# Patient Record
Sex: Male | Born: 1960 | Race: White | Hispanic: No | Marital: Single | Smoking: Former smoker
Health system: Southern US, Community
[De-identification: ages and names within clinical notes are randomized; demographics above are authoritative.]

## PROBLEM LIST (undated history)

## (undated) HISTORY — PX: APPENDECTOMY: SHX54

---

## 2001-06-29 ENCOUNTER — Encounter: Payer: Self-pay | Admitting: Emergency Medicine

## 2001-06-29 ENCOUNTER — Emergency Department (HOSPITAL_COMMUNITY): Admission: EM | Admit: 2001-06-29 | Discharge: 2001-06-29 | Payer: Self-pay | Admitting: Emergency Medicine

## 2002-12-27 ENCOUNTER — Emergency Department (HOSPITAL_COMMUNITY): Admission: EM | Admit: 2002-12-27 | Discharge: 2002-12-27 | Payer: Self-pay | Admitting: Emergency Medicine

## 2015-07-08 ENCOUNTER — Emergency Department (HOSPITAL_COMMUNITY): Payer: Self-pay

## 2015-07-08 ENCOUNTER — Encounter (HOSPITAL_COMMUNITY): Payer: Self-pay

## 2015-07-08 ENCOUNTER — Emergency Department (HOSPITAL_COMMUNITY)
Admission: EM | Admit: 2015-07-08 | Discharge: 2015-07-08 | Disposition: A | Discharge status: Home / Self Care | Payer: Self-pay | Attending: Emergency Medicine | Admitting: Emergency Medicine

## 2015-07-08 DIAGNOSIS — Y9301 Activity, walking, marching and hiking: Secondary | ICD-10-CM | POA: Insufficient documentation

## 2015-07-08 DIAGNOSIS — W01198A Fall on same level from slipping, tripping and stumbling with subsequent striking against other object, initial encounter: Secondary | ICD-10-CM | POA: Insufficient documentation

## 2015-07-08 DIAGNOSIS — Y998 Other external cause status: Secondary | ICD-10-CM | POA: Insufficient documentation

## 2015-07-08 DIAGNOSIS — S52132A Displaced fracture of neck of left radius, initial encounter for closed fracture: Secondary | ICD-10-CM | POA: Insufficient documentation

## 2015-07-08 DIAGNOSIS — Y92093 Driveway of other non-institutional residence as the place of occurrence of the external cause: Secondary | ICD-10-CM | POA: Insufficient documentation

## 2015-07-08 DIAGNOSIS — Z87891 Personal history of nicotine dependence: Secondary | ICD-10-CM | POA: Insufficient documentation

## 2015-07-08 DIAGNOSIS — S2232XA Fracture of one rib, left side, initial encounter for closed fracture: Secondary | ICD-10-CM | POA: Insufficient documentation

## 2015-07-08 MED ORDER — HYDROCODONE-IBUPROFEN 5-200 MG PO TABS
1.0000 | ORAL_TABLET | Freq: Three times a day (TID) | ORAL | Status: AC | PRN
Start: 2015-07-08 — End: ?

## 2015-07-08 NOTE — ED Provider Notes (Signed)
CSN: [id number]     Arrival date & time 07/08/15  4098 History   First MD Initiated Contact with Patient 07/08/15 1012     Chief Complaint  Patient presents with  . Fall  . Shoulder Injury    HPI    54 year old male presents today with left shoulder, elbow, rib pain.atient reports that he was walking down his driveway backwards doing yard work, tripped over his lawnmower falling back landing on his left shoulder and elbow. He reports immediate pain at that time. He reports difficulty with range of motion of the shoulder, elbow, pronation supination of the forearm. He reports bruising and minor abrasions to the posterior shoulder He denies any significant injuries to the shoulder and elbow previously. He does report left-sided rib pain, reports pain with deep inspiration, denies shortness of breath. Patient reports taking ibuprofen at home with minimal relief of symptoms.    History reviewed. No pertinent past medical history. Past Surgical History  Procedure Laterality Date  . Appendectomy     History reviewed. No pertinent family history. Social History  Substance Use Topics  . Smoking status: Former Games developer  . Smokeless tobacco: None  . Alcohol Use: Yes     Comment: social    Review of Systems  All other systems reviewed and are negative.   Allergies  Review of patient's allergies indicates no known allergies.  Home Medications   Prior to Admission medications   Medication Sig Start Date End Date Taking? Authorizing Provider  hydrocodone-ibuprofen (VICOPROFEN) 5-200 MG per tablet Take 1 tablet by mouth every 8 (eight) hours as needed for pain. 07/08/15   Jaedin Regina, PA-C   BP 141/83 mmHg  Pulse 70  Temp(Src) 97.7 F (36.5 C) (Oral)  Resp 17  SpO2 100%   Physical Exam  Constitutional: He is oriented to person, place, and time. He appears well-developed and well-nourished.  HENT:  Head: Normocephalic and atraumatic.  Eyes: Conjunctivae are normal. Pupils are  equal, round, and reactive to light. Right eye exhibits no discharge. Left eye exhibits no discharge. No scleral icterus.  Neck: Normal range of motion. No JVD present. No tracheal deviation present.  Pulmonary/Chest: Effort normal. No stridor.  Musculoskeletal:  Tenderness to palpation of the left radial head, and left shoulder diffusely no obvious gross deformities. Patient has pain with pronation and supination of the left extremity, grip strength 5 out of 5 distal sensation and capillary refill intact  Neurological: He is alert and oriented to person, place, and time. Coordination normal.  Psychiatric: He has a normal mood and affect. His behavior is normal. Judgment and thought content normal.  Nursing note and vitals reviewed.   ED Course  Procedures (including critical care time) Labs Review Labs Reviewed - No data to display  Imaging Review No results found. I, Mckaylin Bastien Todd, personally reviewed and evaluated these images and lab results as part of my medical decision-making.   EKG Interpretation None      MDM   Final diagnoses:  Radial neck fracture, left, closed, initial encounter  Rib fracture, left, closed, initial encounter    Labs:  Imaging: DG shoulder left, DG elbow left, DG ribs left-significant for nondisplaced radial neck fracture, possible acute nondisplaced left sixth rib fracture  Consults:  Therapeutics:  Discharge Meds: Vicoprofen  Assessment/Plan: Patient presents with a nondisplaced radial neck fracture, rib fracture. No pneumothorax, no respiratory distress. Patient will be placed in a sling, instructed follow-up with orthopedic surgery for further evaluation and management. Patient is  given a small course of pain medication for home, given strict return precautions. No other injuries noted on exam         Eyvonne Mechanic, PA-C 07/10/15 1242  Raeford Razor, MD 07/11/15 [phone]

## 2015-07-08 NOTE — Discharge Instructions (Signed)
Please monitor for new or worsening signs or symptoms, return immediately if any present. Please medication only as directed, do not drink drive or operate machinery while taking. Please follow-up with Dr. Roderic Ovens for further evaluation and management this week.

## 2015-07-08 NOTE — ED Notes (Signed)
Pt tripped over mower yesterday afternoon.  Pt c/o left shoulder pain, left rib, left forearm pain.  Limited rotation to left wrist.  Difficulty making a fist.  No obvious deformity noted.  Left hand is colder than right.  Radial pulses equal and bounding.

## 2015-08-01 ENCOUNTER — Emergency Department (HOSPITAL_COMMUNITY)
Admission: EM | Admit: 2015-08-01 | Discharge: 2015-08-01 | Disposition: A | Discharge status: Home / Self Care | Payer: Self-pay | Attending: Emergency Medicine | Admitting: Emergency Medicine

## 2015-08-01 ENCOUNTER — Encounter (HOSPITAL_COMMUNITY): Payer: Self-pay | Admitting: Emergency Medicine

## 2015-08-01 DIAGNOSIS — W1839XS Other fall on same level, sequela: Secondary | ICD-10-CM | POA: Insufficient documentation

## 2015-08-01 DIAGNOSIS — Z7982 Long term (current) use of aspirin: Secondary | ICD-10-CM | POA: Insufficient documentation

## 2015-08-01 DIAGNOSIS — Z87891 Personal history of nicotine dependence: Secondary | ICD-10-CM | POA: Insufficient documentation

## 2015-08-01 DIAGNOSIS — S52122S Displaced fracture of head of left radius, sequela: Secondary | ICD-10-CM

## 2015-08-01 DIAGNOSIS — Z791 Long term (current) use of non-steroidal anti-inflammatories (NSAID): Secondary | ICD-10-CM | POA: Insufficient documentation

## 2015-08-01 MED ORDER — TRAMADOL HCL 50 MG PO TABS: 50.0000 mg | ORAL_TABLET | Freq: Four times a day (QID) | ORAL | Status: AC | PRN

## 2015-08-01 NOTE — Progress Notes (Addendum)
1045 CM spoke with the pt about uninsured PT services via a home health agency.  Pt agreed to HHPT referral with possible cost of $150-200 per session CM reviewed in details home health [facility]) (length of stay in home, types of [facility] staff available, coverage, primary caregiver, up to 24 hrs before services may be started)  CM provided pt/family with a list of [place] home health agencies 1110 Cm left [place] of [facility] a voice message about HHPT referral for pt 1121 CM spoke with [place] at [facility] [address] [place], [place] [address]  [phone] This is the doctor that replaced Dr Reece Agar who pt saw on 05/21/14 last He is eligible to be seen at this office and is an established pt because he has been seen in the last 3 yrs in the offiice Pt given an appt 08/08/15 at 1 pm and asked to call if needs to be changed Pt agreed to this appt This information placed in EPIC f/u d/c section  Pt updated and explained Pt given Dr Ranell Patrick billing office number as [phone] after Cm attempted to call without reaching a billing staff member Pt seen by [facility] and given orange card information Staff left him with her number, affordable care act inform and list of providers  Pt for the second time stated the Rn ws going to give him a rx  Lorelle Formosa NP confirmed she is giving pt ultram for pain  Pt informed ultram will not be filled at [facility] ED and he has to take Rx to a pharmacy to get filled Pt voiced understanding  ED RN updated [phone] Spoke with [facility] staff, Baxter Hire- Cm checked with outpt tx - cost $300, recommended [facility] clinic [phone]- Dr Yetta Barre office does not have office therapy   1536 CM left pt a voice message to inform him [facility] is attempting to reach him and to share that cm attempted to find possible therapy sessions on outpatient level at lower cost without success Cm shared with pt the [facility], contact number [phone]

## 2015-08-01 NOTE — ED Notes (Signed)
Pt was seen on 8/15 for fractured elbow and ribs, states pain has not gotten better despite pain meds. States he does not have insurance to follow up with ortho.

## 2015-08-01 NOTE — ED Provider Notes (Signed)
CSN: [id number]     Arrival date & time 08/01/15  7829 History   First MD Initiated Contact with Patient 08/01/15 [phone]     Chief Complaint  Patient presents with  . Elbow Pain     (Consider location/radiation/quality/duration/timing/severity/associated sxs/prior Treatment) HPI  Dustin Strickland is a 54 y.o male who presents for left elbow pain after fall and radial head fracture on 07/08/2015. He was given pain medication and orthopedic referral at that time but states he cannot get into orthopedics due to financial reasons. He is complaining of increased pain and limited range of motion of the left arm. 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 He has been taking ibuprofen and Advil with minimal relief and states that he has not been able to sleep at night. He denies any fever, chills, numbness or tingling to the arm.   History reviewed. No pertinent past medical history. Past Surgical History  Procedure Laterality Date  . Appendectomy     History reviewed. No pertinent family history. Social History  Substance Use Topics  . Smoking status: Former Games developer  . Smokeless tobacco: None  . Alcohol Use: Yes     Comment: social    Review of Systems  Constitutional: Negative for fever.  Musculoskeletal: Positive for arthralgias. Negative for joint swelling.  Skin: Negative for wound.  Neurological: Negative for numbness.      Allergies  Review of patient's allergies indicates no known allergies.  Home Medications   Prior to Admission medications   Medication Sig Start Date End Date Taking? Authorizing Provider  aspirin EC 81 MG tablet Take 81 mg by mouth daily.   Yes Historical Provider, MD  ibuprofen (ADVIL,MOTRIN) 200 MG tablet Take 400 mg by mouth every 6 (six) hours as needed for moderate pain.   Yes Historical Provider, MD  naproxen sodium (ANAPROX) 220 MG tablet Take 220 mg by mouth 2 (two) times daily  with a meal.   Yes Historical Provider, MD  hydrocodone-ibuprofen (VICOPROFEN) 5-200 MG per tablet Take 1 tablet by mouth every 8 (eight) hours as needed for pain. Patient not taking: Reported on 08/01/2015 07/08/15   Eyvonne Mechanic, PA-C  traMADol (ULTRAM) 50 MG tablet Take 1 tablet (50 mg total) by mouth every 6 (six) hours as needed. 08/01/15   Leland Staszewski Patel-Mills, PA-C   BP 146/92 mmHg  Pulse 67  Temp(Src) 97.8 F (36.6 C) (Oral)  Resp 16  SpO2 99% Physical Exam  Constitutional: He is oriented to person, place, and time. He appears well-developed and well-nourished.  HENT:  Head: Normocephalic.  Eyes: Conjunctivae are normal.  Neck: Neck supple.  Cardiovascular: Normal rate.   Pulmonary/Chest: Effort normal. No respiratory distress.  Musculoskeletal: Normal range of motion.  Patient was in left arm sling which was removed by me. He is able to fully extend the left arm. He is able to flex and extend at the left elbow with minimal difficulty. 2+ radial pulses. Less than 2 second capillary refill. 4/5 grip strength. No swelling or tenderness of the olecranon.   Neurological: He is alert and oriented to person, place, and time.  Skin: Skin is warm and dry.  Psychiatric: He has a normal mood and affect. His behavior is normal.    ED Course  Procedures (including critical care time) Labs Review Labs Reviewed - No data to display  Imaging Review No results found.   EKG Interpretation  None      MDM   Final diagnoses:  Radial head fracture, closed, left, sequela  Patient presents for left elbow pain after fall and radial head fracture that was diagnosed in the ED 3 weeks ago.  He was given arm sling, pain meds, and ortho referral at that time.  He is unable to see an orthopedist due to lack of insurance.  I consulted case management who was able to set up outpatient care.  Patient agrees with the plan.  Rx: Ultram # [address] PA-C 08/01/15 1120  Raeford Razor,  MD 08/01/15 1126

## 2016-04-05 IMAGING — CR DG ELBOW COMPLETE 3+V*L*
5 series · 5 of 5 positions shown · non-contrast
Comparison: None.

CLINICAL DATA: Tripped and fell yesterday at home. Left elbow pain
and left arm pain.

EXAM:
LEFT ELBOW - COMPLETE 3+ VIEW; LEFT HUMERUS - 2+ VIEW

[x elbow ap left]
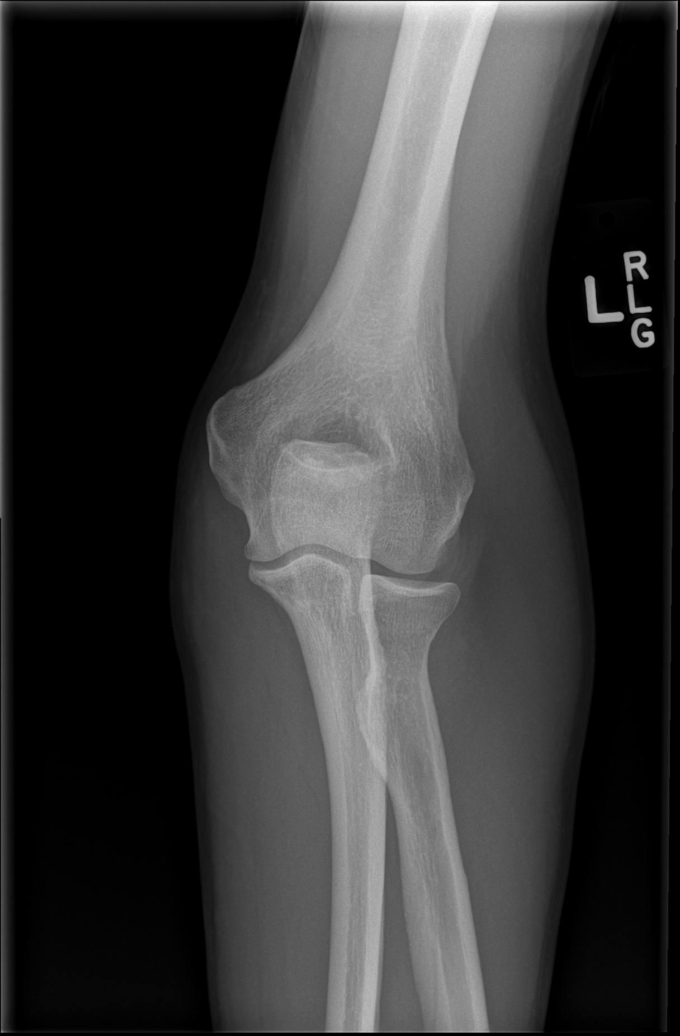

[x elbow obl left (1 of 2)]
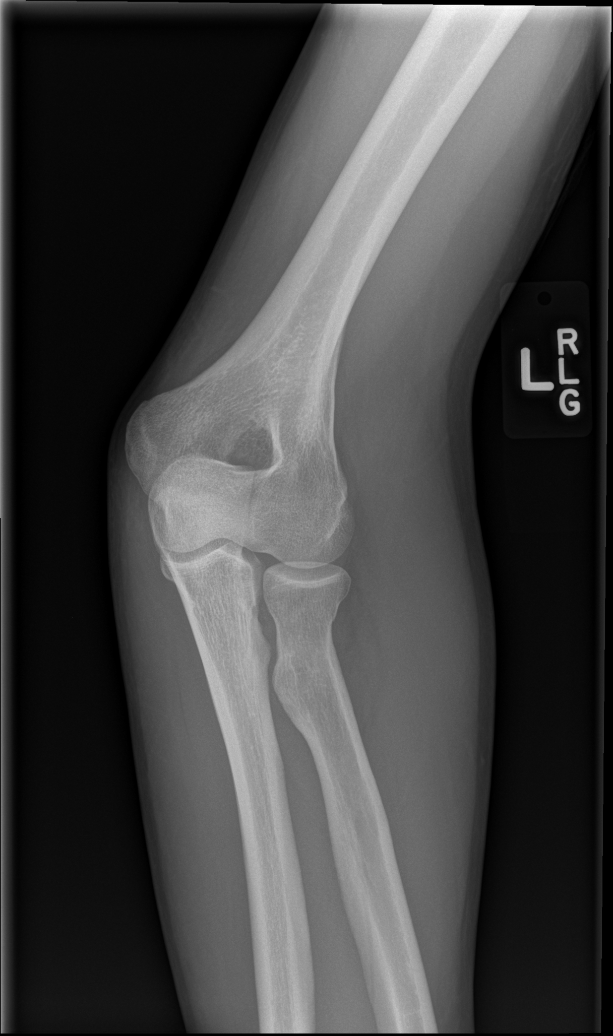

[x elbow obl left (2 of 2)]
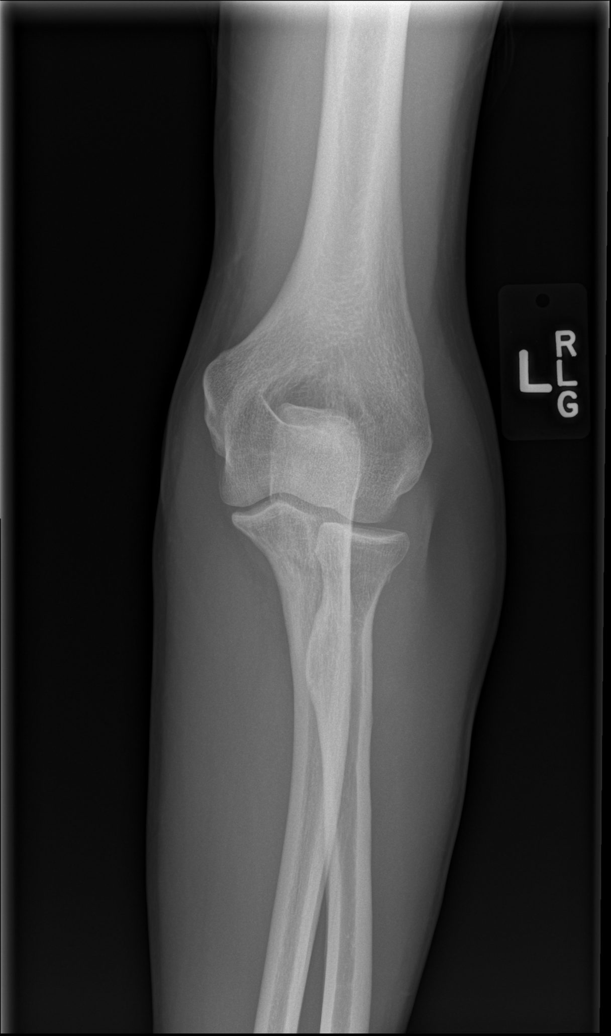

[x elbow lat left (1 of 2)]
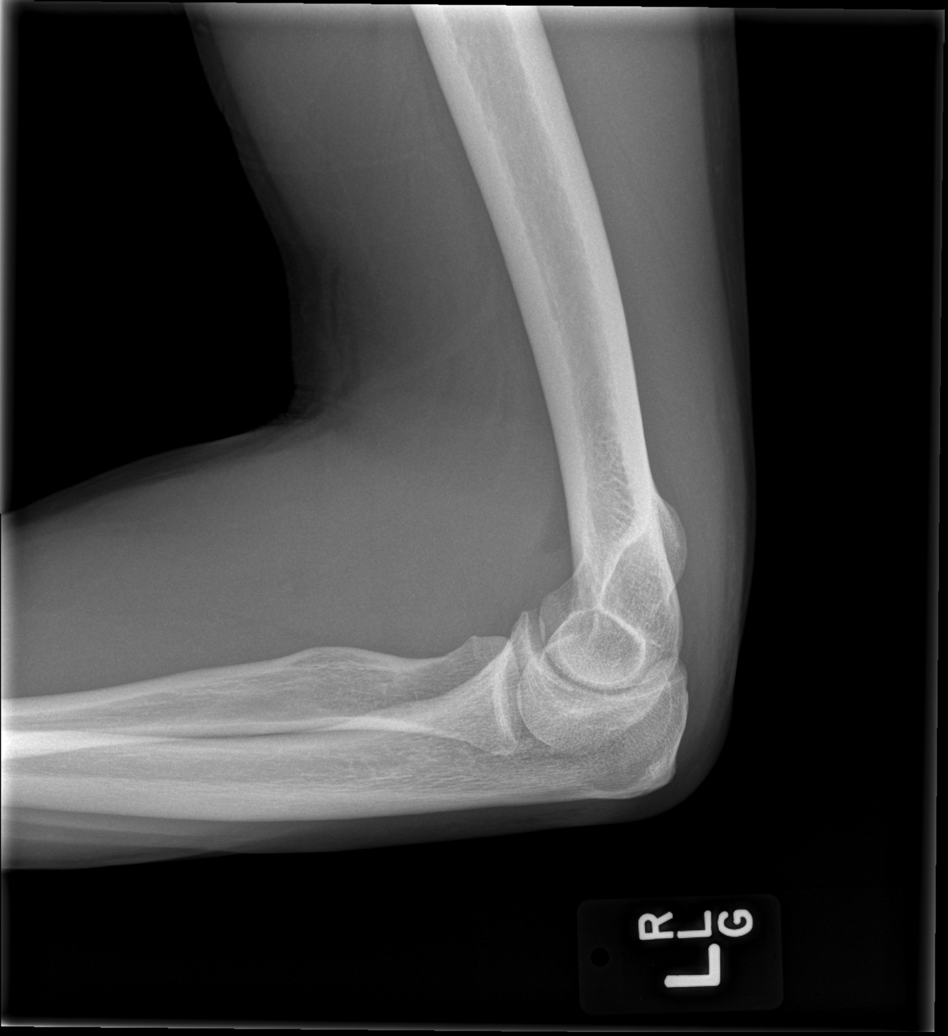

[x elbow lat left (2 of 2)]
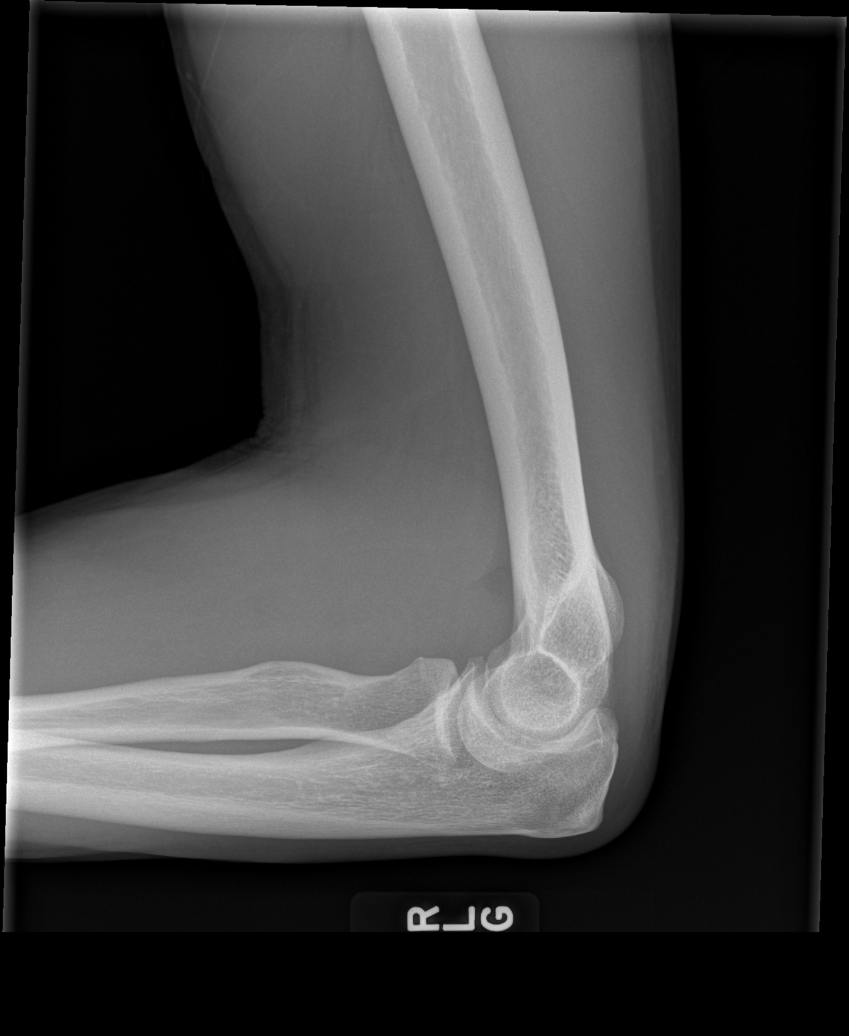

[5 of 5 positions shown; findings below may reference images not displayed]

FINDINGS: Left elbow: The joint spaces are maintained. There is a small elbow
joint effusion. There is a subtle fracture involving the radial
neck. No other bony abnormalities are demonstrated.

Left humerus:

The shoulder joint is maintained.  No humeral shaft fracture.
IMPRESSION: Subtle nondisplaced radial neck fracture and associated elbow joint
effusion.

## 2016-04-05 IMAGING — CR DG HUMERUS 2V *L*
3 series · 3 of 3 positions shown · non-contrast
Comparison: None.

CLINICAL DATA: Tripped and fell yesterday at home. Left elbow pain
and left arm pain.

EXAM:
LEFT ELBOW - COMPLETE 3+ VIEW; LEFT HUMERUS - 2+ VIEW

[w humerus lat left]
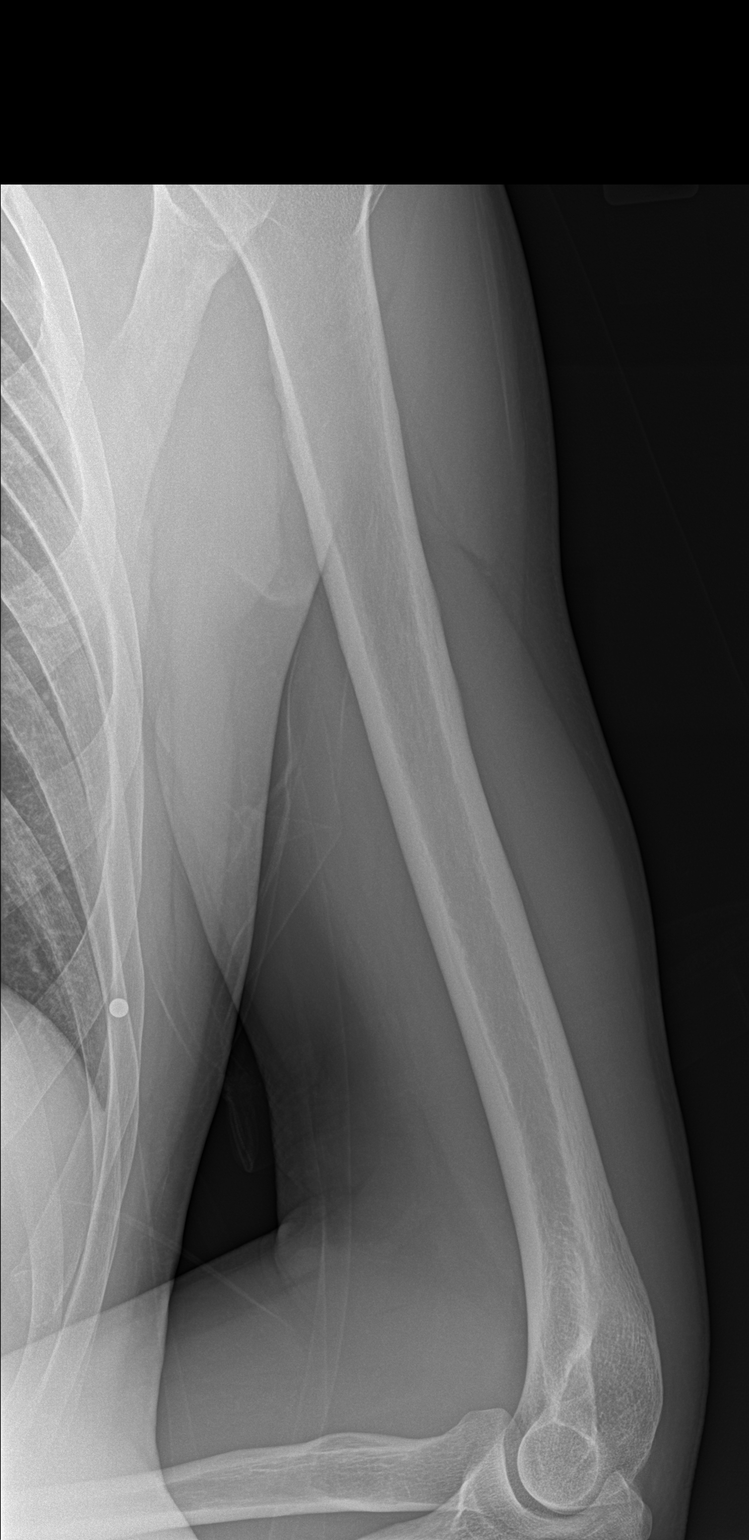

[w humerus ap left (1 of 2)]
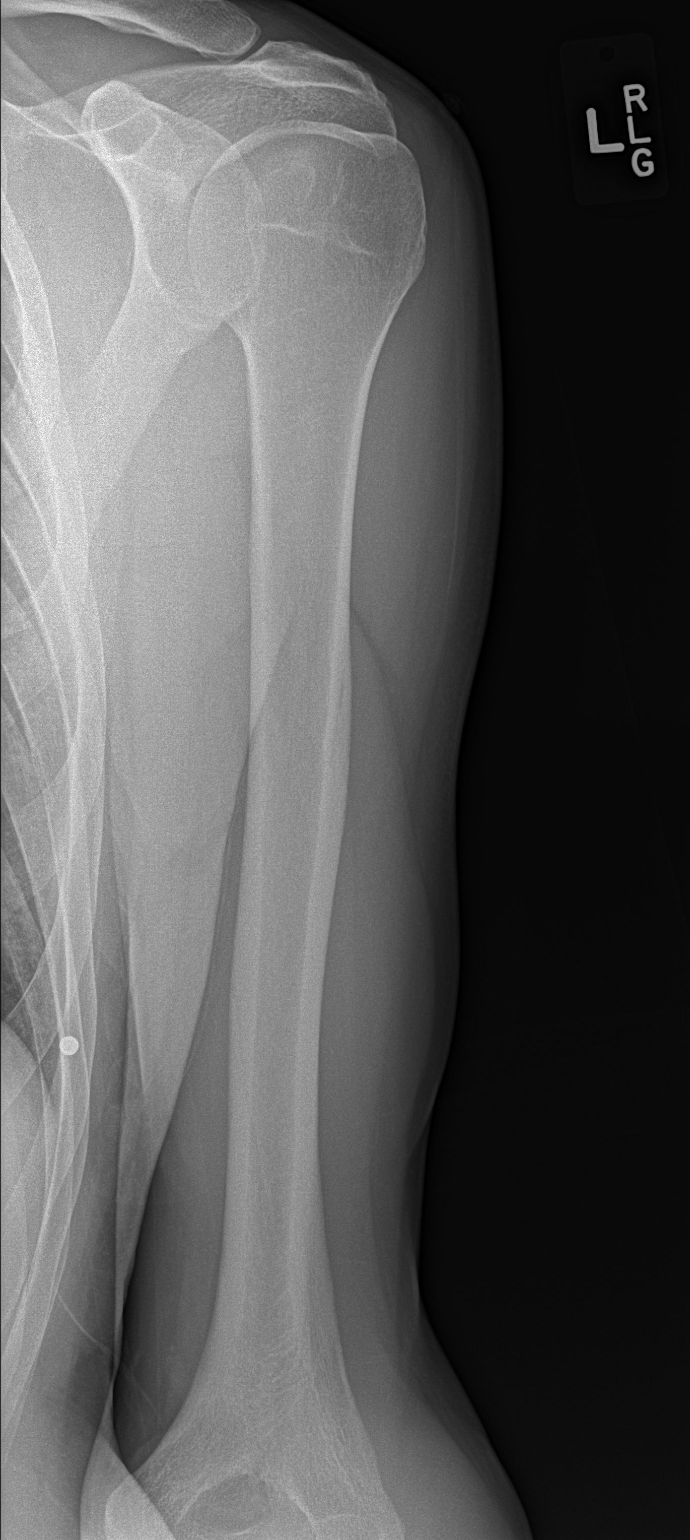

[w humerus ap left (2 of 2)]
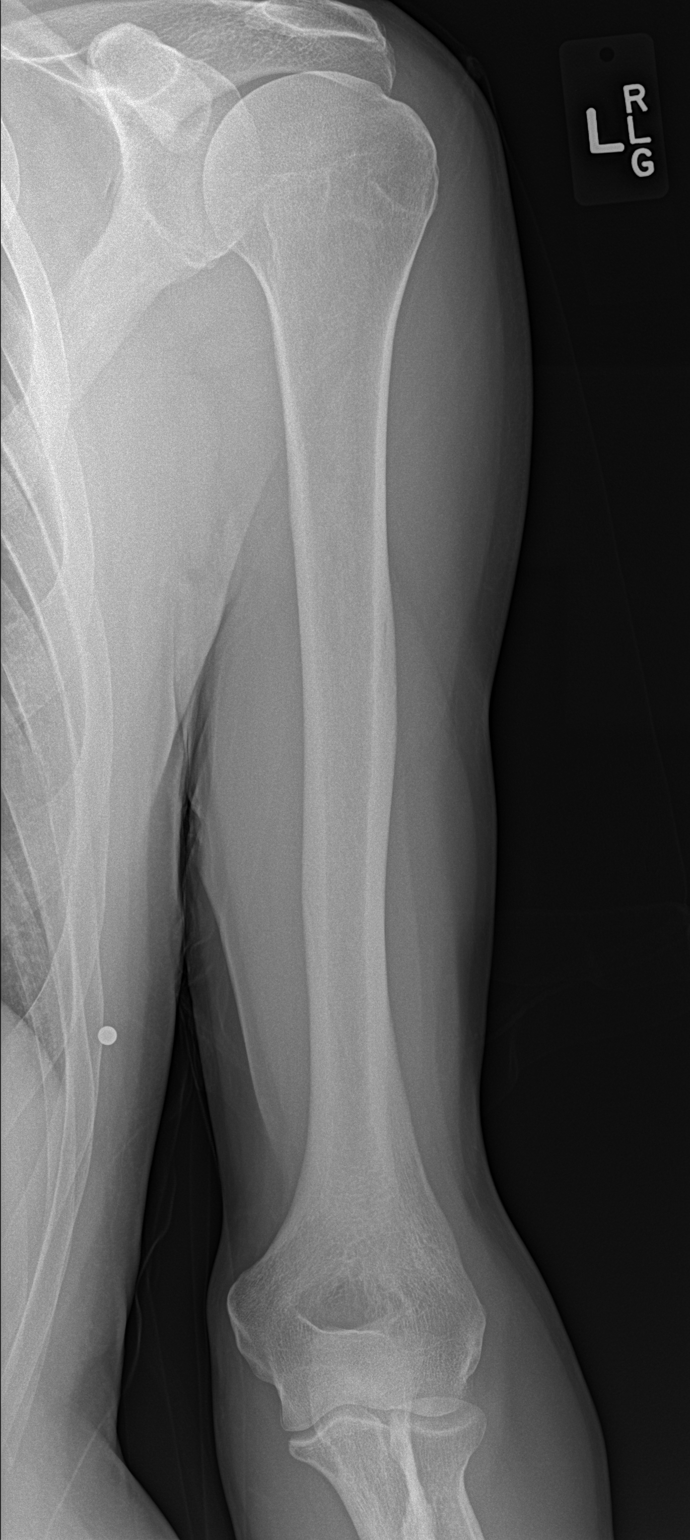

[3 of 3 positions shown; findings below may reference images not displayed]

FINDINGS: Left elbow: The joint spaces are maintained. There is a small elbow
joint effusion. There is a subtle fracture involving the radial
neck. No other bony abnormalities are demonstrated.

Left humerus:

The shoulder joint is maintained.  No humeral shaft fracture.
IMPRESSION: Subtle nondisplaced radial neck fracture and associated elbow joint
effusion.
